# Patient Record
Sex: Male | Born: 1945 | Race: White | Hispanic: No | Smoking: Never smoker
Health system: Southern US, Community
[De-identification: ages and names within clinical notes are randomized; demographics above are authoritative.]

## PROBLEM LIST (undated history)

## (undated) DIAGNOSIS — F32A Depression, unspecified: Secondary | ICD-10-CM

## (undated) DIAGNOSIS — F329 Major depressive disorder, single episode, unspecified: Secondary | ICD-10-CM

## (undated) HISTORY — PX: TONSILLECTOMY AND ADENOIDECTOMY: SHX28

## (undated) HISTORY — DX: Major depressive disorder, single episode, unspecified: F32.9

## (undated) HISTORY — PX: HERNIA REPAIR: SHX51

## (undated) HISTORY — DX: Depression, unspecified: F32.A

---

## 2000-12-11 ENCOUNTER — Ambulatory Visit (HOSPITAL_COMMUNITY): Admission: RE | Admit: 2000-12-11 | Discharge: 2000-12-11 | Payer: Self-pay

## 2004-07-04 ENCOUNTER — Ambulatory Visit: Payer: Self-pay | Admitting: Gastroenterology

## 2006-08-08 ENCOUNTER — Ambulatory Visit: Payer: Self-pay | Admitting: Critical Care Medicine

## 2006-09-14 ENCOUNTER — Ambulatory Visit: Payer: Self-pay | Admitting: Critical Care Medicine

## 2006-09-14 ENCOUNTER — Ambulatory Visit: Payer: Self-pay | Admitting: Cardiology

## 2007-02-07 ENCOUNTER — Ambulatory Visit: Payer: Self-pay | Admitting: Critical Care Medicine

## 2007-02-08 DIAGNOSIS — J309 Allergic rhinitis, unspecified: Secondary | ICD-10-CM | POA: Insufficient documentation

## 2007-02-08 DIAGNOSIS — N400 Enlarged prostate without lower urinary tract symptoms: Secondary | ICD-10-CM | POA: Insufficient documentation

## 2007-02-08 DIAGNOSIS — E785 Hyperlipidemia, unspecified: Secondary | ICD-10-CM | POA: Insufficient documentation

## 2007-03-01 ENCOUNTER — Ambulatory Visit: Payer: Self-pay | Admitting: Critical Care Medicine

## 2010-07-19 NOTE — Assessment & Plan Note (Signed)
[facility]                             PULMONARY OFFICE NOTE   NAME:Ryan Johnson, Ryan Johnson                          MRN:          [id number]  DATE:08/08/2006                            DOB:          11/26/1945    CHIEF COMPLAINT:  Chronic cough.   This is a 65 year old male who 8 weeks ago had an upper respiratory  tract infection with persistent cough. He went through three different  rounds of antibiotics and a course of steroids and inhaled medications  without much improvement. He now is hoarse, the cough is slightly  productive of clear mucus. He has occasional difficulty with cough while  talking. He denies any post-nasal drainage. He is not shortness of  breath. There is no nocturnal cough. The patient has never had a  diagnosis of COPD or asthma. He is a lifelong never smoker. The patient  is referred for further evaluation.   PAST MEDICAL HISTORY:  Essentially no medical problems.   PAST SURGICAL HISTORY:  No surgical history except for knee surgery in  2004 and tonsillectomy as a child.   MEDICATION ALLERGIES:  None.   CURRENT MEDICATIONS:  1. Wellbutrin daily.  2. Crestor daily.  3. Flomax daily.  4. Vitamins daily.   SOCIAL HISTORY:  The patient is self-employed in [employer] and is divorced.   FAMILY HISTORY:  Father had prostate cancer.   REVIEW OF SYSTEMS:  Otherwise, noncontributory.   PHYSICAL EXAMINATION:  Well-developed, well-nourished middle-aged male  in no distress. Temperature 98.0, blood pressure 130/88, pulse 71,  saturation was 97% on room air.  CHEST: Showed distant breath sounds with prolonged expiratory phase. No  wheeze or rhonchi.  CARDIAC: Showed a regular rate and rhythm without S3. Normal S1, S2.  ABDOMEN: Soft, nontender.  EXTREMITIES: Showed no edema or clubbing or venous disease.  SKIN: Was clear.   Spirometry was obtained and revealed an FEV1 of 93% of predicted,  otherwise normal spirometry.   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   PLAN:  Begin Protonix 40 mg daily. Give the patient cyclic cough  protocol with Tussionex Tessalon Perles. No inhaled medicines and no  further systemic steroids or other type of inhalants necessary.   RECOMMENDATIONS:  Follow this patient up in six weeks. Note is made of  the fact that he did not bring his chest films with him. He will obtain  these from primary care physician and I will review them.     Charlcie Cradle Delford Field, MD, [facility]  Electronically Signed    PEW/MedQ  DD: 08/08/2006  DT: 08/08/2006  Job #: [id number]   cc:   Louanna Raw

## 2010-07-19 NOTE — Assessment & Plan Note (Signed)
[facility]                             PULMONARY OFFICE NOTE   JOSEALBERTO, MONTALTO                          MRN:          [id number]  DATE:02/07/2007                            DOB:          03-Sep-1945    Mr. Aldava returns in followup, still having a dry cough after talking for  long periods of time.  He is having some shortness of breath with this.  Notes post-nasal drainage.  He stopped all of the medications we  prescribed this summer for the cough, including Protonix, Nasacort, and  Tessalon, and tramadol.   EXAM:  Temperature 98, blood pressure 140/88, pulse 88, saturation 96%  on room air.  CHEST:  Entirely clear to auscultation and percussion.  There was no  evidence of wheeze, rale, or rhonchi.  CARDIAC:  Showed a regular rate and rhythm without S3.  Normal S1, S2.  ABDOMEN:  Soft, nontender.  EXTREMITIES:  No edema or clubbing.  SKIN:  Clear.  NEUROLOGIC:  Intact.  Nares showed mild nasal inflammation.   IMPRESSION:  Cyclic cough, likely on the basis of post-nasal drip  syndrome.  No true evidence of reflux disease.   PLAN:  To resume the cyclic cough protocol, utilizing Tussionex and  Tessalon pearls.  He will also begin Duramist 2 sprays each nostril  daily, and Brovex 5 mL b.i.d.  We will see the patient back in followup  in 2 weeks for reassessment.     Charlcie Cradle Delford Field, MD, [facility]  Electronically Signed    PEW/MedQ  DD: 02/08/2007  DT: 02/08/2007  Job #: [id number]

## 2010-07-19 NOTE — Assessment & Plan Note (Signed)
[facility]                             PULMONARY OFFICE NOTE   Ryan Johnson, Ryan Johnson                          MRN:          [id number]  DATE:09/14/2006                            DOB:          May 23, 1945    HISTORY:  The patient is a 65 year old white male who was seen  previously for a cyclic cough.  The cough has returned.  He had some  transient improvement after his visit in early June, but now is worse.  The cough occasionally is now productive of green material.  He is  having some sinus drainage, but no real sinus pressure.  He just  finished a 15-day course of Avelox a week ago.  He did not take the  Protonix fully as prescribed.  He still has some Tussionex left and  Tessalon Perles left.   PHYSICAL EXAMINATION:  VITAL SIGNS:  On exam temp is 98, blood pressure  130/82, pulse 89 and saturation 96% on room air.  CHEST:  The chest shows to be completely clear without evidence of  wheeze or rhonchi.  HEART:  Cardiac exam shows a regular rate and rhythm without S3.  Normal  S1 and S2.  ABDOMEN:  The abdomen is soft and nontender.  EXTREMITIES:  The extremities show no edema, clubbing or venous disease.  SKIN:  The skin is clear.  NEUROLOGIC:  The neurologic exam is intact.  HEENT AND NECK:  The HEENT and neck shows no jugular venous distention.  No lymphadenopathy.  Oropharynx is clear.  Neck is supple.  Nares are  examined and show bilateral nasal inflammation compatible with acute  rhinitis.   IMPRESSION:  1. Potential underlying occult sinusitis as a mechanism for cyclic      cough.  2. Possible occult reflux disease also driving cyclic cough.   PLAN:  The plan for this patient is to:  1. Pursue Nasacort 2 sprays each nostril daily.  2. Begin Ultram 1 four times a day times 90 tablets.  3. The patient will receive Omnicef 600 mg for 10 days.  4. The patient will receive Tussionex refill.  5. The patient is to insure Protonix usage  daily.  6. We will obtain a CT scan of his sinuses today.     Charlcie Cradle Delford Field, MD, [facility]  Electronically Signed    PEW/MedQ  DD: 09/14/2006  DT: 09/14/2006  Job #: [id number]   cc:   Louanna Raw

## 2013-01-09 ENCOUNTER — Encounter: Payer: Self-pay | Admitting: Internal Medicine

## 2013-03-12 ENCOUNTER — Ambulatory Visit
Admission: RE | Admit: 2013-03-12 | Discharge: 2013-03-12 | Disposition: A | Discharge status: Home / Self Care | Payer: Medicare Other | Source: Ambulatory Visit | Attending: Internal Medicine | Admitting: Internal Medicine

## 2013-03-12 ENCOUNTER — Other Ambulatory Visit: Payer: Self-pay | Admitting: Internal Medicine

## 2013-03-12 ENCOUNTER — Inpatient Hospital Stay (HOSPITAL_COMMUNITY): Admission: AD | Admit: 2013-03-12 | Payer: Medicare Other | Source: Ambulatory Visit | Admitting: Internal Medicine

## 2013-03-12 DIAGNOSIS — R112 Nausea with vomiting, unspecified: Secondary | ICD-10-CM

## 2013-03-12 DIAGNOSIS — R109 Unspecified abdominal pain: Secondary | ICD-10-CM

## 2013-03-12 MED ORDER — IOHEXOL 300 MG/ML  SOLN
100.0000 mL | Freq: Once | INTRAMUSCULAR | Status: AC | PRN
  Administered 2013-03-12: 100 mL via INTRAVENOUS

## 2013-03-12 MED ORDER — IOHEXOL 300 MG/ML  SOLN
30.0000 mL | Freq: Once | INTRAMUSCULAR | Status: AC | PRN
  Administered 2013-03-12: 30 mL via ORAL

## 2013-04-04 ENCOUNTER — Encounter: Payer: Self-pay | Admitting: Internal Medicine

## 2013-04-06 HISTORY — PX: CHOLECYSTECTOMY: SHX55

## 2014-06-03 ENCOUNTER — Encounter: Payer: Self-pay | Admitting: Internal Medicine

## 2014-06-16 ENCOUNTER — Ambulatory Visit (AMBULATORY_SURGERY_CENTER): Payer: Self-pay | Admitting: *Deleted

## 2014-06-16 VITALS — Ht 70.0 in | Wt 189.6 lb

## 2014-06-16 DIAGNOSIS — Z1211 Encounter for screening for malignant neoplasm of colon: Secondary | ICD-10-CM

## 2014-06-16 NOTE — Progress Notes (Signed)
Denies allergies to eggs or soy products. Denies complications with sedation or anesthesia. Denies O2 use. Denies use of diet or weight loss medications.  Emmi instructions given for colonoscopy.  

## 2014-06-29 ENCOUNTER — Telehealth: Payer: Self-pay | Admitting: Internal Medicine

## 2014-06-29 NOTE — Telephone Encounter (Signed)
No charge. 

## 2014-06-30 ENCOUNTER — Encounter: Payer: Medicare Other | Admitting: Internal Medicine

## 2015-11-30 IMAGING — CT CT ABD-PELV W/ CM
2 of 5 series · 17 of 46 positions shown, 19 images · IV contrast (30CC OMNI 300 & [ID] OMNI 300)
Comparison: None.

CLINICAL DATA: Increasing right upper quadrant abdominal pain.
Nausea and vomiting. Chills.

EXAM:
CT ABDOMEN AND PELVIS WITH CONTRAST
TECHNIQUE: Multidetector CT imaging of the abdomen and pelvis was performed
using the standard protocol following bolus administration of
intravenous contrast.
CONTRAST:  30mL OMNIPAQUE IOHEXOL 300 MG/ML SOLN, 100mL OMNIPAQUE
IOHEXOL 300 MG/ML SOLN
BUN and creatinine were obtained on site at [HOSPITAL] at
[HOSPITAL].
Results:  BUN 12 mg/dL,  Creatinine 1.0 mg/dL.

[Series 2: abd/pelvis with · axial · 0.84mm/px · z∈[-364,+51]mm · 14 of 93 slices shown, 16 images]
[im 5/93  soft-tissue]
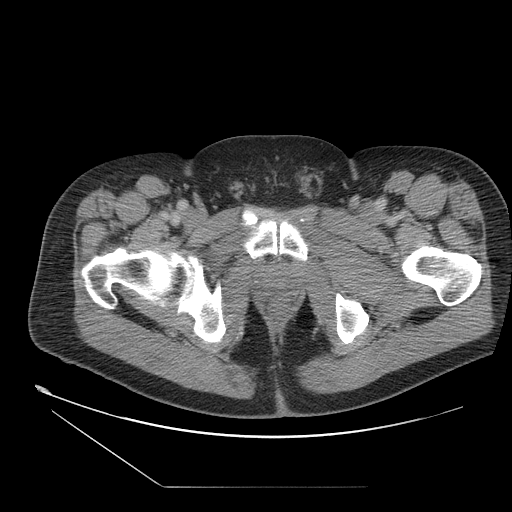
[im 5/93  bone]
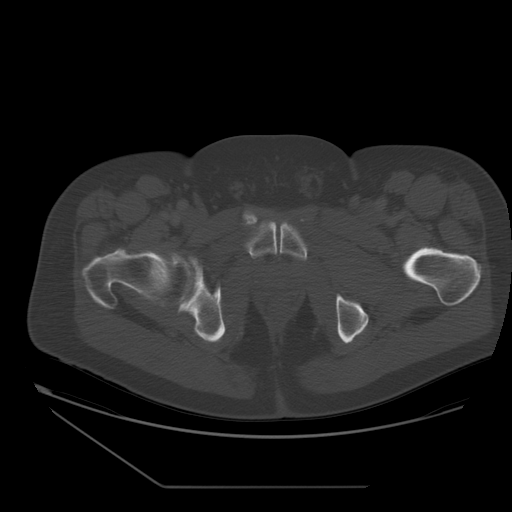
[im 14/93  soft-tissue]
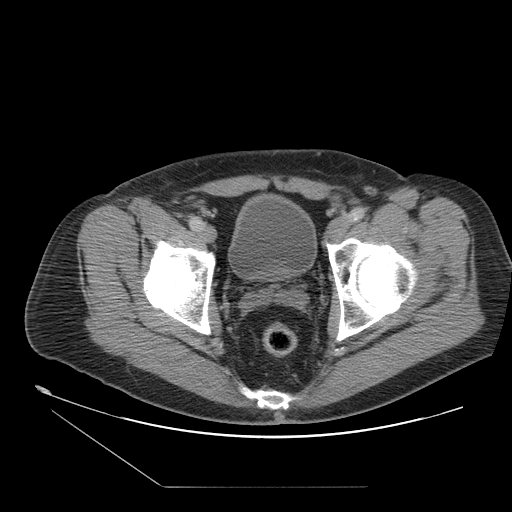
[im 19/93  soft-tissue]
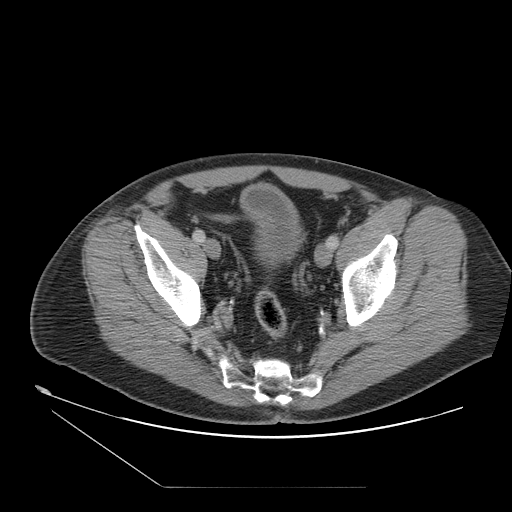
[im 24/93  soft-tissue]
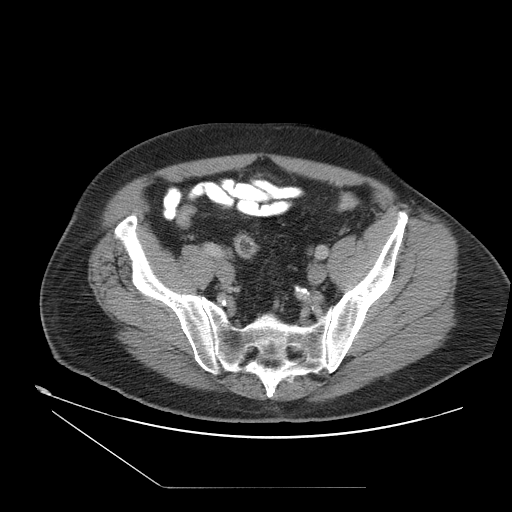
[im 33/93  soft-tissue]
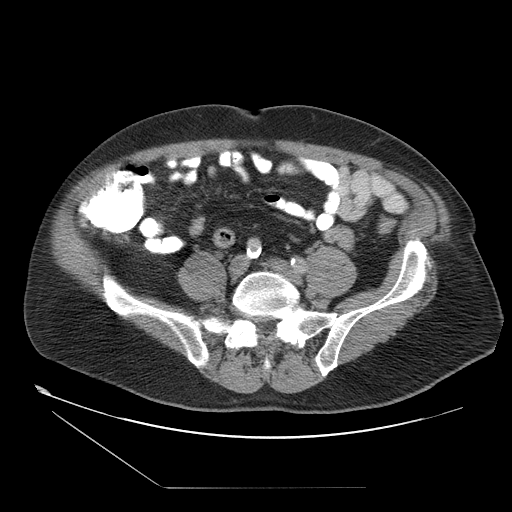
[im 37/93  soft-tissue]
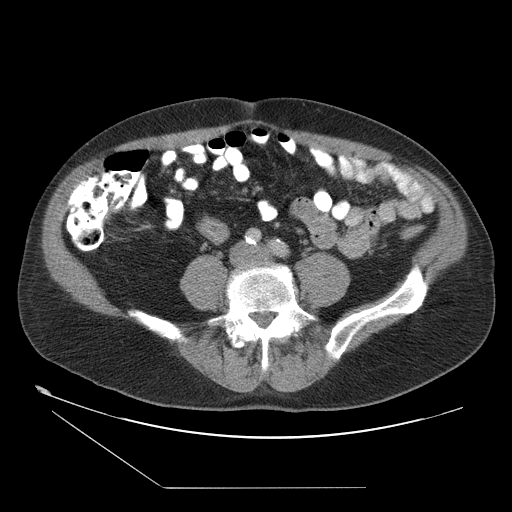
[im 42/93  soft-tissue]
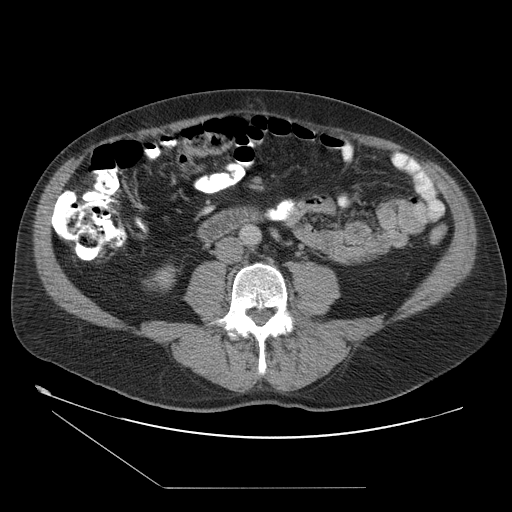
[im 51/93  soft-tissue]
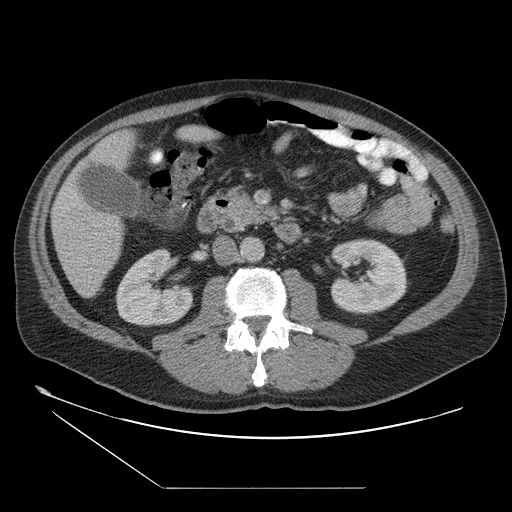
[im 56/93  soft-tissue]
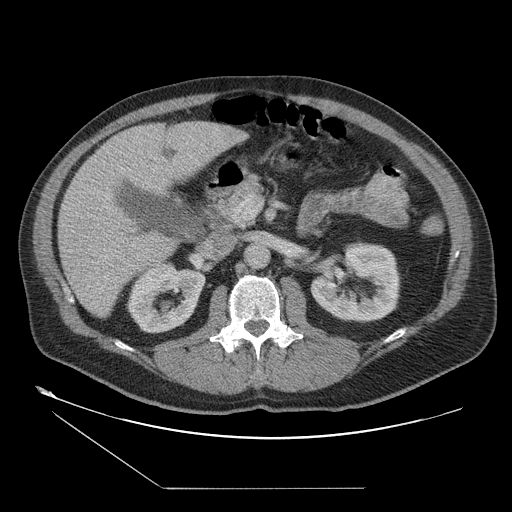
[im 56/93  bone]
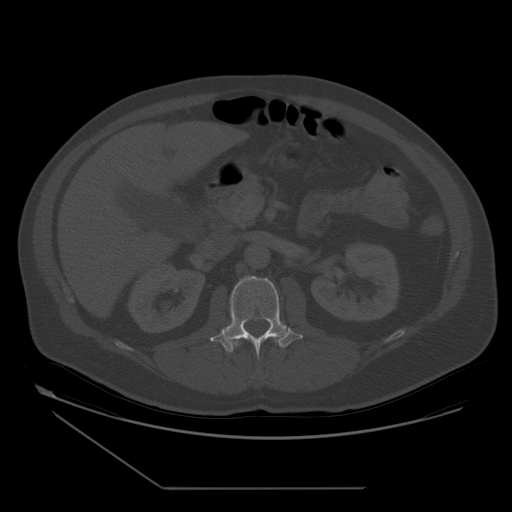
[im 60/93  soft-tissue]
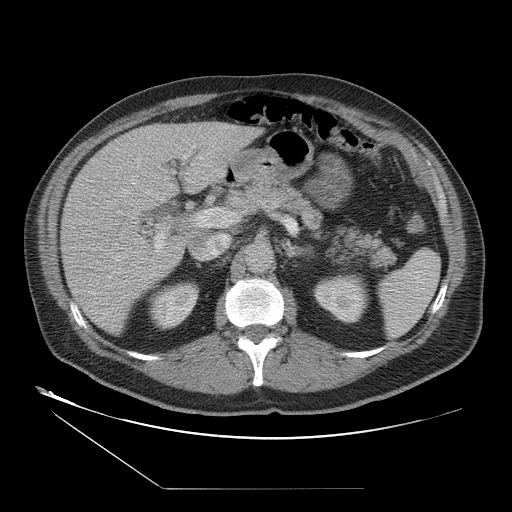
[im 70/93  soft-tissue]
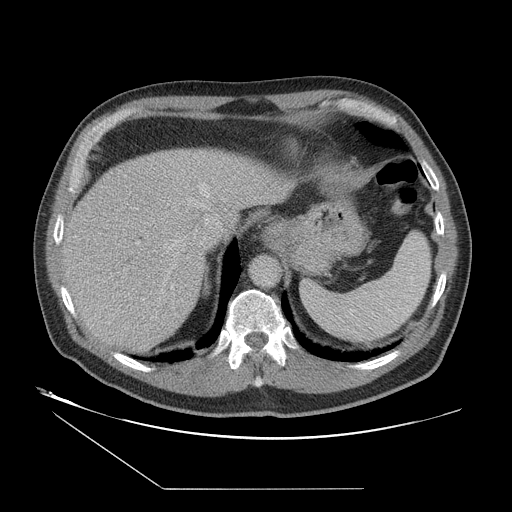
[im 74/93  soft-tissue]
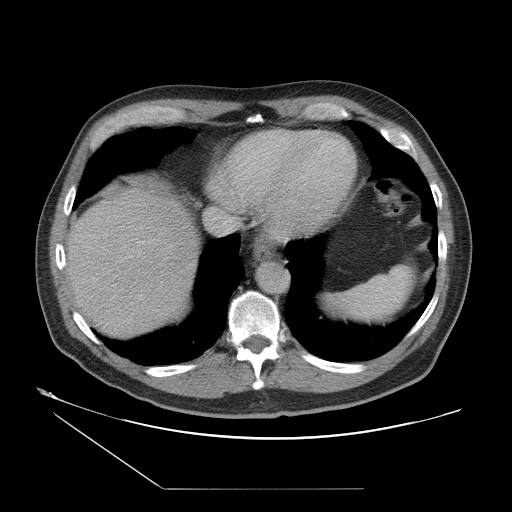
[im 79/93  soft-tissue]
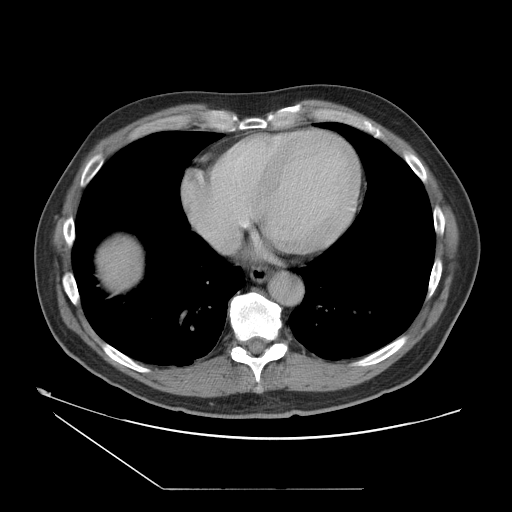
[im 88/93  soft-tissue]
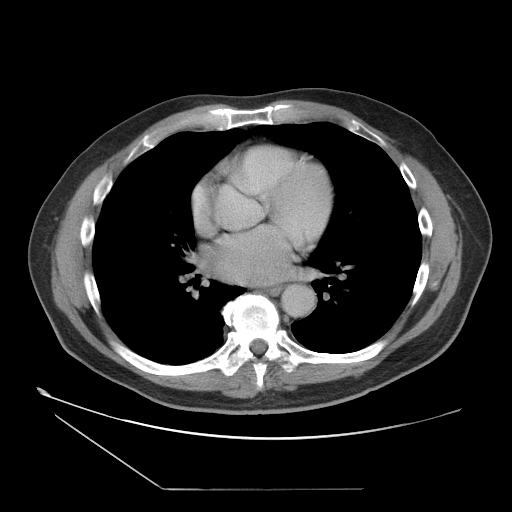

[Series 400: cor · coronal · 1.02mm/px · 3 of 120 slices shown]
[im 40/120  soft-tissue]
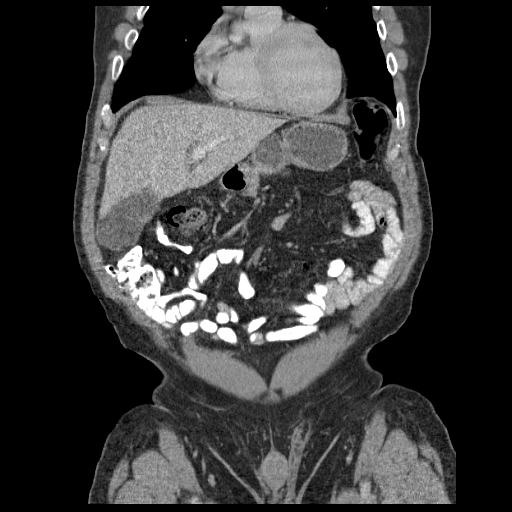
[im 53/120  soft-tissue]
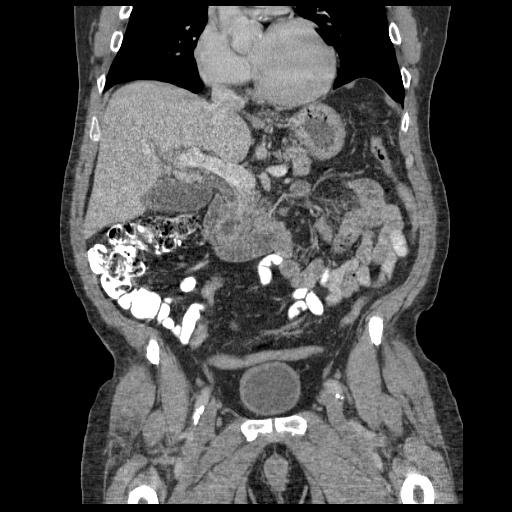
[im 67/120  soft-tissue]
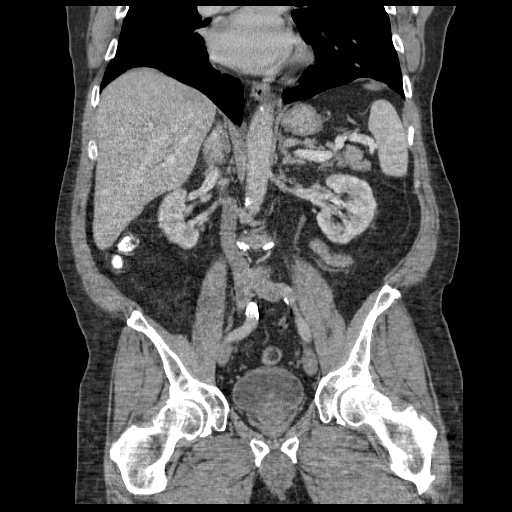

[17 of 46 positions shown; findings below may reference images not displayed]

FINDINGS: Atherosclerotic calcification noted proximally in the left anterior
descending coronary artery.

Gallbladder unremarkable. Common hepatic duct mildly dilated at 1
cm. Common bile duct mildly dilated at 0.8 cm. Suspected small
periampullary duodenal diverticulum. If possible filling defect in
the distal CBD on image 56 of series 400, favoring
choledocholithiasis. Mild intrahepatic biliary dilatation.

Punctate 3 x 1 mm calculus of the right kidney lower pole,
nonobstructive. 1-2 mm left kidney lower pole nonobstructive
calculus, image 70 of series 400. No hydronephrosis or hydroureter.
No ureteral calculus. Urinary bladder unremarkable.

Small hypodense exophytic lesion of the left mid kidney posteriorly,
likely a cyst.

Spleen, pancreas, and adrenal glands normal.

Aortoiliac atherosclerotic calcification. No pathologic upper
abdominal adenopathy is observed. No pathologic pelvic adenopathy is
observed. No free pelvic fluid. There is loss of articular space in
both hips, right greater than left, with associated spurring.
IMPRESSION: 1. Intrahepatic and extrahepatic biliary dilatation associated with
a density in the distal CBD suspicious for stone or less likely
small mass.
2. Atherosclerosis.
3. Single bilateral nonobstructive lower pole renal calculi.
4. Degenerative arthropathy of both hips, left greater than right.
